# Patient Record
Sex: Female | Born: 1935 | Race: White | Hispanic: No | Marital: Married | Smoking: Never smoker
Health system: Southern US, Community
[De-identification: ages and names within clinical notes are randomized; demographics above are authoritative.]

## PROBLEM LIST (undated history)

## (undated) DIAGNOSIS — R5383 Other fatigue: Secondary | ICD-10-CM

## (undated) DIAGNOSIS — G4700 Insomnia, unspecified: Secondary | ICD-10-CM

## (undated) DIAGNOSIS — E079 Disorder of thyroid, unspecified: Secondary | ICD-10-CM

## (undated) DIAGNOSIS — R42 Dizziness and giddiness: Secondary | ICD-10-CM

## (undated) DIAGNOSIS — T7840XA Allergy, unspecified, initial encounter: Secondary | ICD-10-CM

## (undated) DIAGNOSIS — E785 Hyperlipidemia, unspecified: Secondary | ICD-10-CM

## (undated) HISTORY — DX: Dizziness and giddiness: R42

## (undated) HISTORY — DX: Insomnia, unspecified: G47.00

## (undated) HISTORY — DX: Other fatigue: R53.83

## (undated) HISTORY — PX: COCHLEAR IMPLANT: SUR684

## (undated) HISTORY — DX: Disorder of thyroid, unspecified: E07.9

## (undated) HISTORY — PX: CHOLECYSTECTOMY: SHX55

## (undated) HISTORY — DX: Allergy, unspecified, initial encounter: T78.40XA

## (undated) HISTORY — DX: Hyperlipidemia, unspecified: E78.5

---

## 2005-10-31 ENCOUNTER — Ambulatory Visit: Payer: Self-pay | Admitting: Family Medicine

## 2006-04-26 ENCOUNTER — Ambulatory Visit: Payer: Self-pay | Admitting: Gastroenterology

## 2006-05-31 ENCOUNTER — Ambulatory Visit: Payer: Self-pay

## 2006-12-27 ENCOUNTER — Ambulatory Visit: Payer: Self-pay | Admitting: Family Medicine

## 2007-11-19 ENCOUNTER — Ambulatory Visit: Payer: Self-pay | Admitting: Family Medicine

## 2007-12-24 ENCOUNTER — Ambulatory Visit: Payer: Self-pay | Admitting: General Surgery

## 2008-04-15 ENCOUNTER — Ambulatory Visit: Payer: Self-pay | Admitting: Family Medicine

## 2009-05-20 ENCOUNTER — Ambulatory Visit: Payer: Self-pay | Admitting: Family Medicine

## 2009-09-03 ENCOUNTER — Encounter: Payer: Self-pay | Admitting: Otolaryngology

## 2009-09-15 ENCOUNTER — Encounter: Payer: Self-pay | Admitting: Otolaryngology

## 2010-06-10 ENCOUNTER — Ambulatory Visit: Payer: Self-pay | Admitting: Otolaryngology

## 2010-08-01 ENCOUNTER — Ambulatory Visit: Payer: Self-pay | Admitting: Gastroenterology

## 2011-07-26 ENCOUNTER — Ambulatory Visit: Payer: Self-pay | Admitting: Internal Medicine

## 2011-08-09 ENCOUNTER — Ambulatory Visit: Payer: Self-pay | Admitting: Internal Medicine

## 2012-10-30 ENCOUNTER — Ambulatory Visit: Payer: Self-pay | Admitting: Internal Medicine

## 2013-01-06 ENCOUNTER — Ambulatory Visit: Payer: Self-pay | Admitting: Internal Medicine

## 2013-04-12 IMAGING — US ULTRASOUND RIGHT BREAST
1 series · 14 of 14 positions shown · non-contrast
Comparison: none

REASON FOR EXAM: nodule
COMMENTS:

PROCEDURE:     US  - US BREAST RIGHT  - August 09, 2011  [DATE]
RESULT:     Evaluation in the region of interest within the right breast, in
the periareolar region, demonstrates no solid or cystic sonographic
abnormalities.

[Series 1: ultrasound right breast · 14 of 14 slices shown]
[im 1/14]
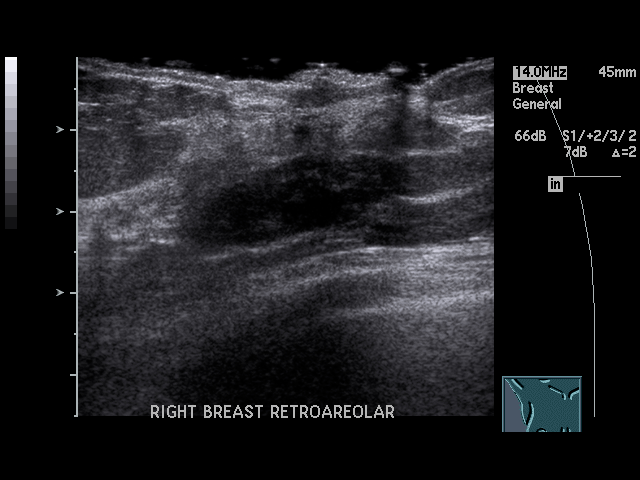
[im 2/14]
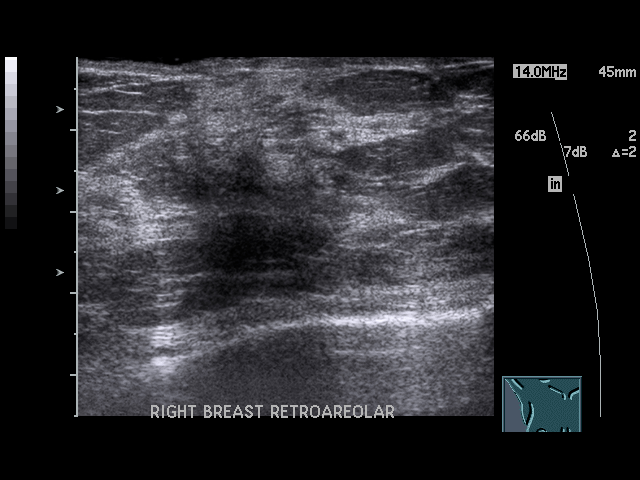
[im 3/14]
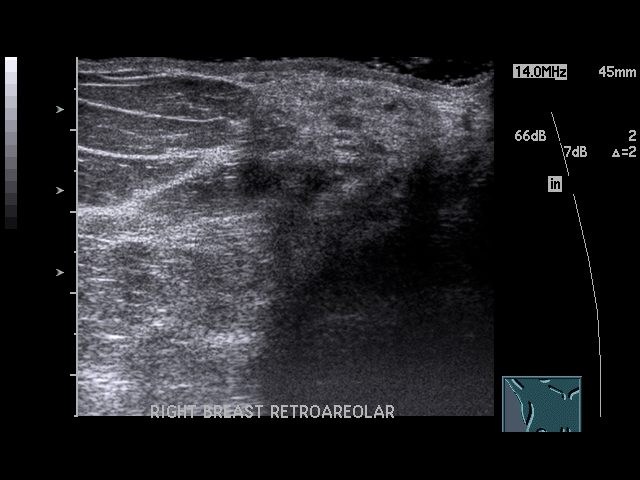
[im 4/14]
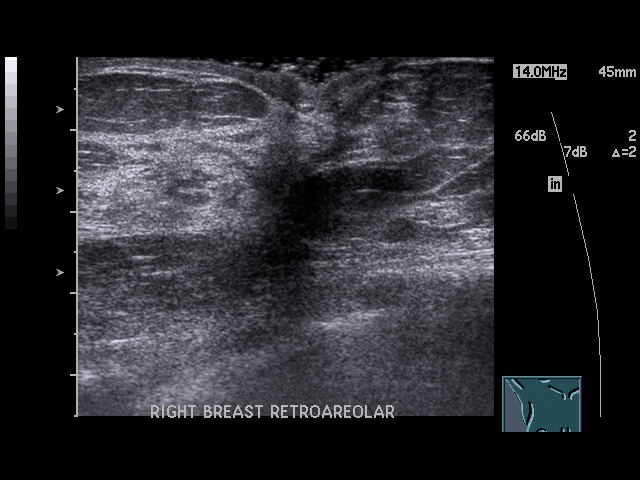
[im 5/14]
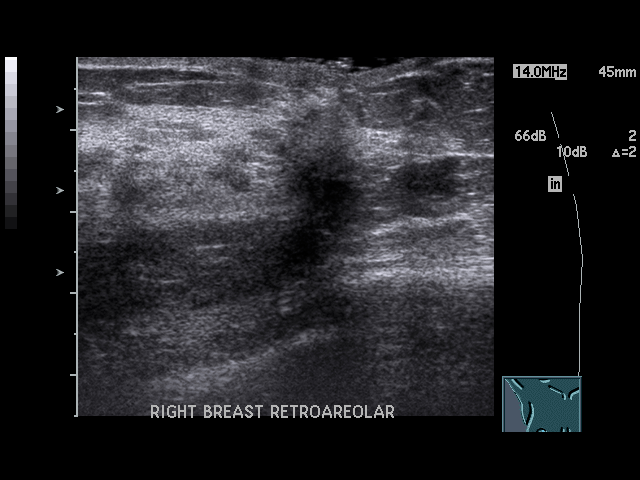
[im 6/14]
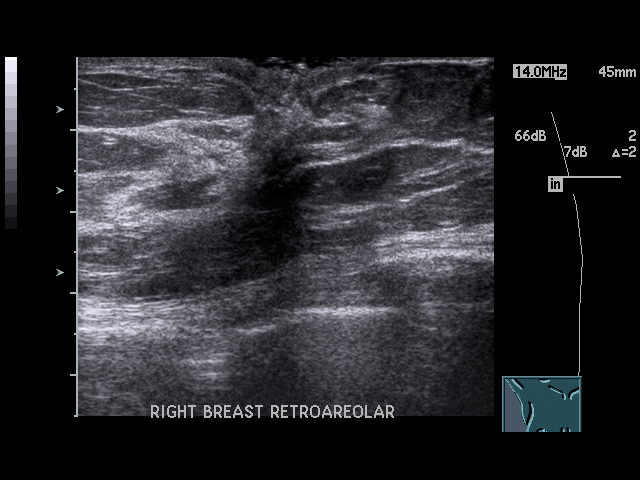
[im 7/14]
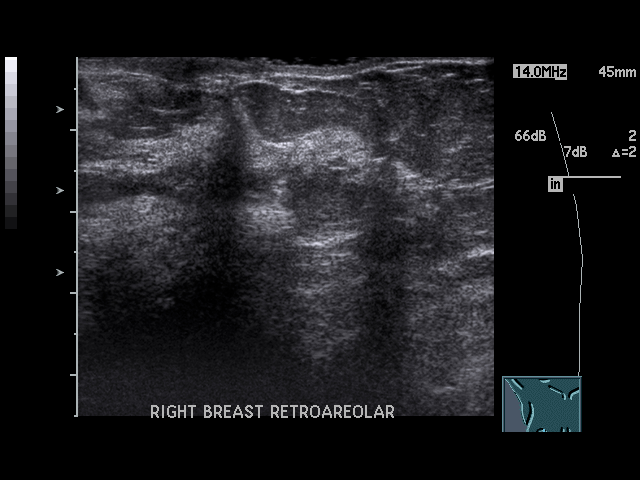
[im 8/14]
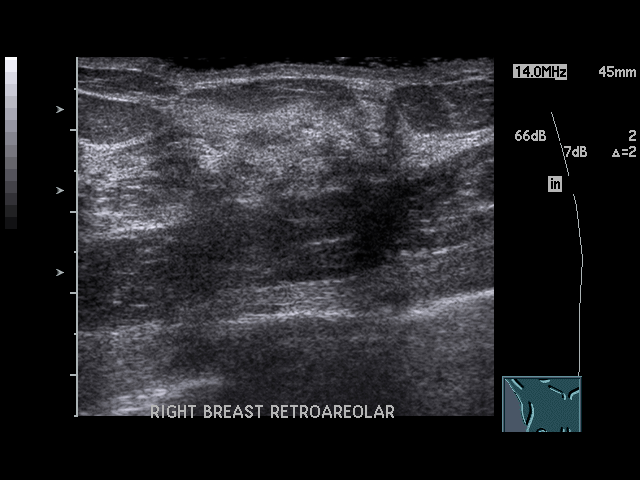
[im 9/14]
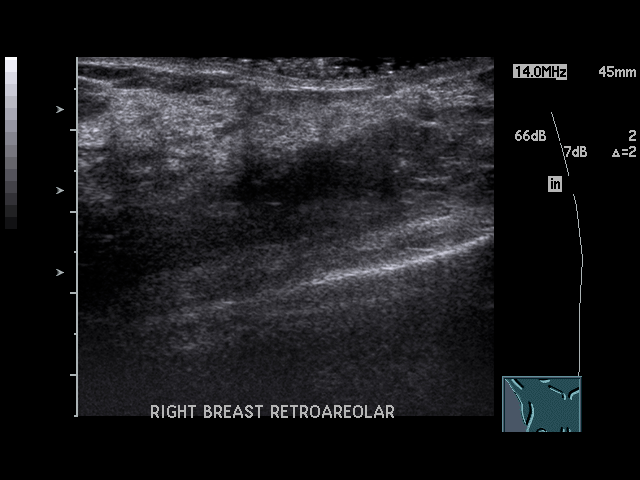
[im 10/14]
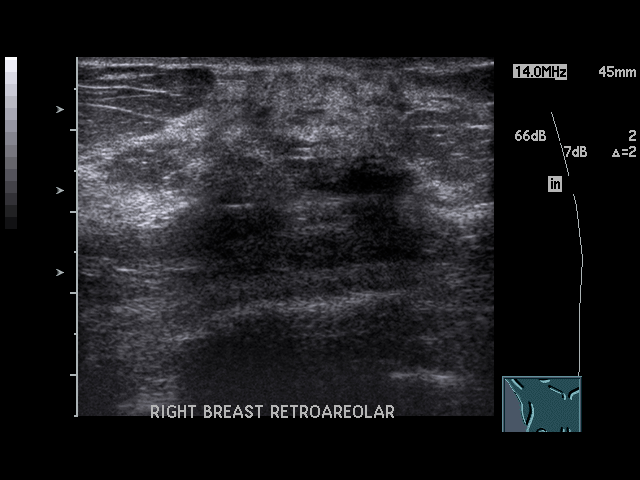
[im 11/14]
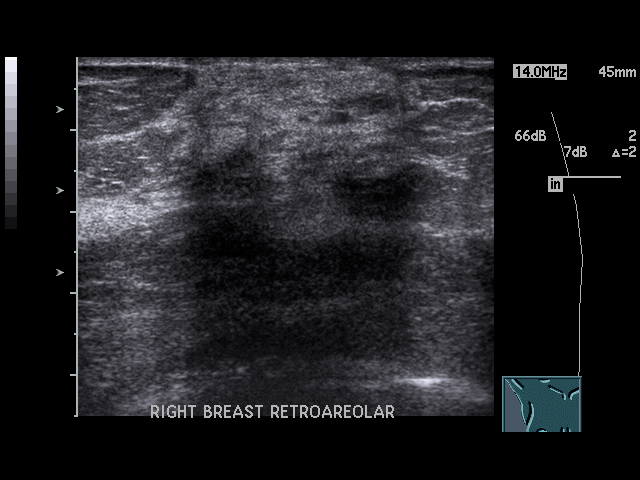
[im 12/14]
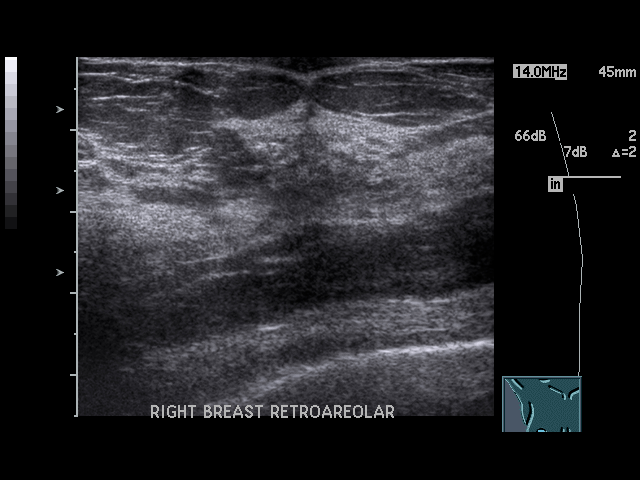
[im 13/14]
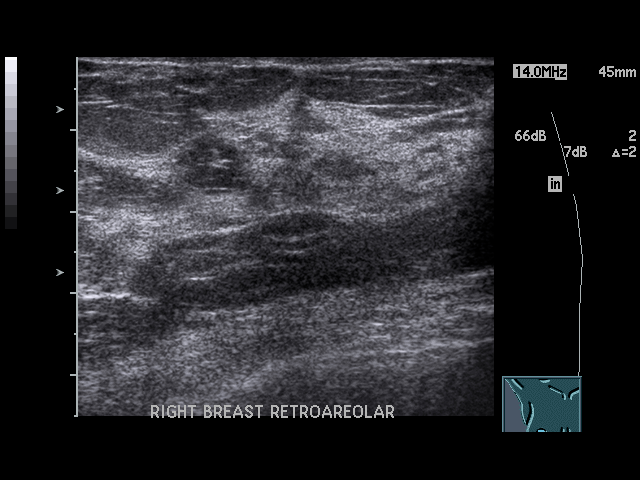
[im 14/14]
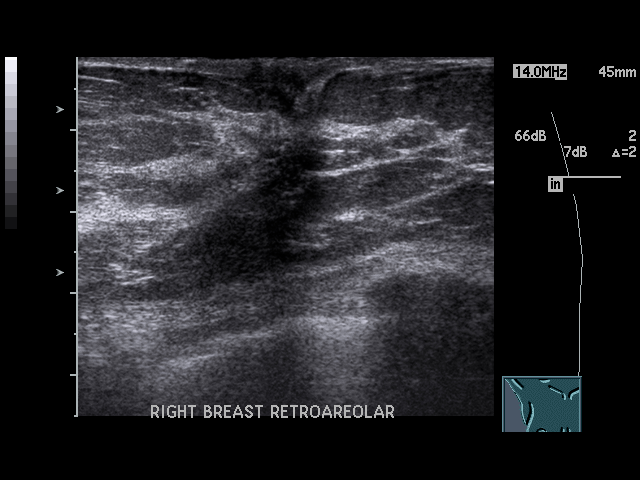

[14 of 14 positions shown; findings below may reference images not displayed]

IMPRESSION: Unremarkable Right Breast Ultrasound. Please refer to
additional radiographic view dictation for completed discussion.

## 2014-11-18 ENCOUNTER — Emergency Department: Payer: Self-pay | Admitting: Student

## 2014-11-18 LAB — URINALYSIS, COMPLETE
BILIRUBIN, UR: NEGATIVE
Blood: NEGATIVE
GLUCOSE, UR: NEGATIVE mg/dL (ref 0–75)
Hyaline Cast: 28
Nitrite: NEGATIVE
Ph: 6 (ref 4.5–8.0)
Protein: 30
SQUAMOUS EPITHELIAL: NONE SEEN
Specific Gravity: 1.023 (ref 1.003–1.030)

## 2014-11-18 LAB — COMPREHENSIVE METABOLIC PANEL
ALT: 27 U/L (ref 14–63)
Albumin: 4 g/dL (ref 3.4–5.0)
Alkaline Phosphatase: 94 U/L (ref 46–116)
Anion Gap: 13 (ref 7–16)
BUN: 19 mg/dL — ABNORMAL HIGH (ref 7–18)
Bilirubin,Total: 1.4 mg/dL — ABNORMAL HIGH (ref 0.2–1.0)
CALCIUM: 9.6 mg/dL (ref 8.5–10.1)
CREATININE: 0.8 mg/dL (ref 0.60–1.30)
Chloride: 104 mmol/L (ref 98–107)
Co2: 22 mmol/L (ref 21–32)
EGFR (Non-African Amer.): >60
GLUCOSE: 151 mg/dL — AB (ref 65–99)
Osmolality: 283 (ref 275–301)
Potassium: 4.1 mmol/L (ref 3.5–5.1)
SGOT(AST): 38 U/L — ABNORMAL HIGH (ref 15–37)
SODIUM: 139 mmol/L (ref 136–145)
TOTAL PROTEIN: 7.4 g/dL (ref 6.4–8.2)

## 2014-11-18 LAB — CBC
HCT: 47.9 % — ABNORMAL HIGH (ref 35.0–47.0)
HGB: 16.3 g/dL — AB (ref 12.0–16.0)
MCH: 32.1 pg (ref 26.0–34.0)
MCHC: 33.9 g/dL (ref 32.0–36.0)
MCV: 95 fL (ref 80–100)
PLATELETS: 213 10*3/uL (ref 150–440)
RBC: 5.06 10*6/uL (ref 3.80–5.20)
RDW: 12.7 % (ref 11.5–14.5)
WBC: 9.3 10*3/uL (ref 3.6–11.0)

## 2014-11-18 LAB — TROPONIN I: Troponin-I: <0.02 ng/mL

## 2014-11-18 LAB — LIPASE, BLOOD: Lipase: 128 U/L (ref 73–393)

## 2015-07-01 ENCOUNTER — Encounter: Payer: Self-pay | Admitting: Family Medicine

## 2015-07-01 ENCOUNTER — Ambulatory Visit (INDEPENDENT_AMBULATORY_CARE_PROVIDER_SITE_OTHER): Payer: Medicare PPO | Admitting: Family Medicine

## 2015-07-01 VITALS — BP 132/80 | HR 81 | Temp 97.9°F | Resp 16 | Ht 63.5 in | Wt 137.6 lb

## 2015-07-01 DIAGNOSIS — D751 Secondary polycythemia: Secondary | ICD-10-CM | POA: Diagnosis not present

## 2015-07-01 DIAGNOSIS — E559 Vitamin D deficiency, unspecified: Secondary | ICD-10-CM

## 2015-07-01 DIAGNOSIS — Z23 Encounter for immunization: Secondary | ICD-10-CM | POA: Diagnosis not present

## 2015-07-01 DIAGNOSIS — E785 Hyperlipidemia, unspecified: Secondary | ICD-10-CM

## 2015-07-01 DIAGNOSIS — E039 Hypothyroidism, unspecified: Secondary | ICD-10-CM

## 2015-07-01 DIAGNOSIS — Z9621 Cochlear implant status: Secondary | ICD-10-CM | POA: Diagnosis not present

## 2015-07-01 DIAGNOSIS — R739 Hyperglycemia, unspecified: Secondary | ICD-10-CM | POA: Diagnosis not present

## 2015-07-01 DIAGNOSIS — E876 Hypokalemia: Secondary | ICD-10-CM

## 2015-07-01 DIAGNOSIS — R42 Dizziness and giddiness: Secondary | ICD-10-CM | POA: Diagnosis not present

## 2015-07-01 MED ORDER — DIAZEPAM 2 MG PO TABS: 2.0000 mg | ORAL_TABLET | Freq: Four times a day (QID) | ORAL | Status: AC | PRN

## 2015-07-01 MED ORDER — ZOSTER VACCINE LIVE 19400 UNT/0.65ML ~~LOC~~ SOLR: 0.6500 mL | Freq: Once | SUBCUTANEOUS | Status: AC

## 2015-07-01 NOTE — Progress Notes (Signed)
Date:  07/01/2015   Name:  Margaret Solis   DOB:  1934-06-30   MRN:  [id number]  PCP:  Filbert Berthold, NP    Chief Complaint: Hypothyroidism   History of Present Illness:  This is a 81 y.o. female with cochlear implant L ear, gets intermittent vertigo/nausea for which she takes Valium 2 mg prn, perhaps once a month on average. Leaving soon for vacation and travel often exacerbates, needs refill. Denies any significant sedation or confusion with med. On alternating Synthroid 75/50 daily, no labs since dose change. On vitamin D supplementation but unsure of dose. Also taking niacin for HLD but unsure dose. Blood work in February showed hyperglycemia, elevated AST, polycythemia. Last tetanus 3 yrs ago, got flu imm 2d ago, needs pneumococcal and zoster imms.  Review of Systems:  Review of Systems  Constitutional: Negative for fever, fatigue and unexpected weight change.  Respiratory: Negative for shortness of breath.   Cardiovascular: Negative for chest pain and leg swelling.  Gastrointestinal: Negative for abdominal pain.  Neurological: Negative for tremors and light-headedness.    Patient Active Problem List   Diagnosis Date Noted  . Hypothyroidism 07/01/2015  . Hyperlipidemia 07/01/2015  . Vitamin D deficiency 07/01/2015  . Vertigo, intermittent 07/01/2015  . Cochlear implant in place 07/01/2015  . Hyperglycemia 07/01/2015  . Polycythemia 07/01/2015    Prior to Admission medications   Medication Sig Start Date End Date Taking? Authorizing Provider  hydrochlorothiazide (HYDRODIURIL) 25 MG tablet Take 25 mg by mouth every morning. 04/28/15  Yes Historical Provider, MD  KLOR-CON M20 20 MEQ tablet TAKE 1 TABLET BY MOUTH ONCE DAILY WITH FOOD 04/28/15  Yes Historical Provider, MD  levothyroxine (SYNTHROID, LEVOTHROID) 50 MCG tablet Take 50 mcg by mouth daily before breakfast. Alternate as directed   Yes Historical Provider, MD  levothyroxine (SYNTHROID, LEVOTHROID) 75 MCG tablet Take 75 mcg by  mouth daily before breakfast. Alternate days   Yes Historical Provider, MD  mometasone (ELOCON) 0.1 % lotion PLACE 4 DROPS EACH EAR AT BEDTIME X 1 WEEK AND THEN AS NEEDED FOR DRY SKIN AND ITCHING 05/18/15  Yes Historical Provider, MD  diazepam (VALIUM) 2 MG tablet Take 1 tablet (2 mg total) by mouth every 6 (six) hours as needed (for dizziness). 07/01/15   Schuyler Amor, MD  zoster vaccine live, PF, (ZOSTAVAX) 95621 UNT/0.65ML injection Inject 19,400 Units into the skin once. 07/01/15   Schuyler Amor, MD    No Known Allergies  Past Surgical History  Procedure Laterality Date  . Cholecystectomy    . Cochlear implant      Social History  Substance Use Topics  . Smoking status: Never Smoker   . Smokeless tobacco: Not on file  . Alcohol Use: 0.0 oz/week    0 Standard drinks or equivalent per week    Family History  Problem Relation Age of Onset  . Hypertension Son     Medication list has been reviewed and updated.  Physical Examination: BP 132/80 mmHg  Pulse 81  Temp(Src) 97.9 F (36.6 C) (Oral)  Resp 16  Ht 5' 3.5" (1.613 m)  Wt 137 lb 9.6 oz (62.415 kg)  BMI 23.99 kg/m2  Physical Exam  Constitutional: She appears well-developed and well-nourished.  Cardiovascular: Normal rate, regular rhythm and normal heart sounds.   Pulmonary/Chest: Effort normal and breath sounds normal.  Musculoskeletal: She exhibits no edema.  Neurological: She is alert. Coordination normal.  Skin: Skin is warm and dry.  Psychiatric: She has a normal mood  and affect. Her behavior is normal.  Nursing note and vitals reviewed.   Assessment and Plan:  1. Vertigo, intermittent Due to cochlear implant, continue HCTZ and Klor-con - diazepam (VALIUM) 2 MG tablet; Take 1 tablet (2 mg total) by mouth every 6 (six) hours as needed (for dizziness).  Dispense: 30 tablet; Refill: 0  2. Hypothyroidism, unspecified hypothyroidism type Recheck lab on alternating dose Synthroid - TSH  3.  Hyperlipidemia Recheck lipids on niacin - Lipid Profile  4. Vitamin D deficiency Recheck level on supplementation - Vitamin D (25 hydroxy)  5. Cochlear implant in place  6. Hyperglycemia - HgB A1c - Comprehensive metabolic panel  7. Polycythemia - CBC  8. Delayed imms Prevnar today, rx for Zostavax sent   Return in about 6 months (around 12/29/2015).  Dionne Ano. Kingsley Spittle MD [facility]  07/01/2015

## 2015-07-02 ENCOUNTER — Telehealth: Payer: Self-pay | Admitting: Family Medicine

## 2015-07-02 LAB — CBC
Hematocrit: 38.3 % (ref 34.0–46.6)
Hemoglobin: 13.5 g/dL (ref 11.1–15.9)
MCH: 32.5 pg (ref 26.6–33.0)
MCHC: 35.2 g/dL (ref 31.5–35.7)
MCV: 92 fL (ref 79–97)
PLATELETS: 205 10*3/uL (ref 150–379)
RBC: 4.15 x10E6/uL (ref 3.77–5.28)
RDW: 13.1 % (ref 12.3–15.4)
WBC: 6.5 10*3/uL (ref 3.4–10.8)

## 2015-07-02 NOTE — Telephone Encounter (Signed)
April at [facility] needs a [facility] referral for pt's visit today.  She saw [employer].  April's call back number is [phone]

## 2015-07-02 NOTE — Telephone Encounter (Signed)
Pt not found on [employer] portal.

## 2015-07-03 LAB — COMPREHENSIVE METABOLIC PANEL
ALK PHOS: 73 IU/L (ref 39–117)
ALT: 11 IU/L (ref 0–32)
AST: 16 IU/L (ref 0–40)
Albumin/Globulin Ratio: 1.8 (ref 1.1–2.5)
Albumin: 4.1 g/dL (ref 3.5–4.8)
BUN/Creatinine Ratio: 29 — ABNORMAL HIGH (ref 11–26)
BUN: 18 mg/dL (ref 8–27)
Bilirubin Total: 0.7 mg/dL (ref 0.0–1.2)
CALCIUM: 9.1 mg/dL (ref 8.7–10.3)
CO2: 20 mmol/L (ref 18–29)
CREATININE: 0.62 mg/dL (ref 0.57–1.00)
Chloride: 100 mmol/L (ref 97–108)
GFR calc Af Amer: 99 mL/min/{1.73_m2} (ref >59)
GFR, EST NON AFRICAN AMERICAN: 86 mL/min/{1.73_m2} (ref >59)
GLOBULIN, TOTAL: 2.3 g/dL (ref 1.5–4.5)
GLUCOSE: 82 mg/dL (ref 65–99)
Potassium: 4.3 mmol/L (ref 3.5–5.2)
SODIUM: 141 mmol/L (ref 134–144)
Total Protein: 6.4 g/dL (ref 6.0–8.5)

## 2015-07-03 LAB — LIPID PANEL
Chol/HDL Ratio: 3.9 ratio units (ref 0.0–4.4)
Cholesterol, Total: 260 mg/dL — ABNORMAL HIGH (ref 100–199)
HDL: 66 mg/dL (ref >39)
LDL CALC: 167 mg/dL — AB (ref 0–99)
TRIGLYCERIDES: 135 mg/dL (ref 0–149)

## 2015-07-03 LAB — TSH: TSH: 0.791 u[IU]/mL (ref 0.450–4.500)

## 2015-07-03 LAB — HEMOGLOBIN A1C: HEMOGLOBIN A1C: 5.7 % — AB (ref 4.8–5.6)

## 2015-07-03 LAB — VITAMIN D 25 HYDROXY (VIT D DEFICIENCY, FRACTURES): VIT D 25 HYDROXY: 33.9 ng/mL (ref 30.0–100.0)

## 2015-07-05 ENCOUNTER — Telehealth: Payer: Self-pay | Admitting: Family Medicine

## 2015-07-05 NOTE — Telephone Encounter (Signed)
Pt. Came by today needing a referral to Dr. Patrici Ranks  Chiropractic pt states that she had appt. Today [phone], and maybe  9-23. Pt call back # is  [phone]

## 2015-07-05 NOTE — Telephone Encounter (Signed)
Called the insurance company she doesn't need any approval from her insurance for routine office visit and reference number for this call is [id number] and [facility]

## 2015-07-06 NOTE — Telephone Encounter (Signed)
Pt advised.

## 2015-09-21 ENCOUNTER — Other Ambulatory Visit: Payer: Self-pay

## 2015-10-20 ENCOUNTER — Telehealth: Payer: Self-pay | Admitting: Family Medicine

## 2015-10-20 MED ORDER — LEVOTHYROXINE SODIUM 75 MCG PO TABS: 75.0000 ug | ORAL_TABLET | Freq: Every day | ORAL | Status: DC

## 2015-10-20 NOTE — Telephone Encounter (Signed)
Please let her know this was sent to the pharmacy.  I would like to check her TSH level in the near future. Can she come by and pick up a lab slip sometime soon?  If so, can you order this?

## 2015-10-20 NOTE — Telephone Encounter (Signed)
Pt states she need a 90 day supply of  75 mg of Levothroid she said she did better with the doses  [facility] pt cal back # [phone].

## 2015-10-20 NOTE — Telephone Encounter (Signed)
Left detailed message on home ans. Machine.

## 2015-10-22 ENCOUNTER — Other Ambulatory Visit: Payer: Self-pay | Admitting: Family Medicine

## 2015-10-22 DIAGNOSIS — E039 Hypothyroidism, unspecified: Secondary | ICD-10-CM

## 2015-10-29 LAB — TSH: TSH: 0.578 u[IU]/mL (ref 0.450–4.500)

## 2015-12-13 ENCOUNTER — Other Ambulatory Visit: Payer: Self-pay | Admitting: Family Medicine

## 2016-08-11 ENCOUNTER — Ambulatory Visit (INDEPENDENT_AMBULATORY_CARE_PROVIDER_SITE_OTHER): Payer: Medicare PPO | Admitting: Family Medicine

## 2016-08-11 ENCOUNTER — Encounter: Payer: Self-pay | Admitting: Family Medicine

## 2016-08-11 VITALS — BP 136/74 | HR 75 | Temp 98.0°F | Resp 16 | Ht 63.5 in | Wt 140.0 lb

## 2016-08-11 DIAGNOSIS — I499 Cardiac arrhythmia, unspecified: Secondary | ICD-10-CM

## 2016-08-11 DIAGNOSIS — I493 Ventricular premature depolarization: Secondary | ICD-10-CM | POA: Diagnosis not present

## 2016-08-11 DIAGNOSIS — R110 Nausea: Secondary | ICD-10-CM

## 2016-08-11 MED ORDER — ONDANSETRON HCL 4 MG PO TABS: 4.0000 mg | ORAL_TABLET | Freq: Three times a day (TID) | ORAL | 0 refills | Status: AC | PRN

## 2016-08-11 NOTE — Patient Instructions (Signed)
Thank you for coming in to clinic today.  1. I am reassured by both of your symptoms, I think the most likely explanation is stress induced with the nausea and upset stomach. This should improve on it's own. You may try OTC Pepto Bismol for symptomatic relief of upset stomach, try to avoid spicy and greasy foods as well. Stay well hydrated with plenty of water. If you have worsening nausea, then try the Zofran rx medication sent to pharmacy one pill every 8 hours as needed for nausea and vomiting. Additionally for the irregular heart beats, you may have what is called "PVCs - premature ventricular contractions" which are the most common type of occasional irregular heart beats or palpitations. From review of your prior EKG and my exam and your history without other symptoms of chest pain and shortness of breath, I do not think you have Atrial Fibrillation or other more concerning heart arrhythmia. Your old EKG did show some old Left Bundle Branch Block changes, this can be due to a variety of causes, I do recommend that you establish with a Cardiologist after your move to re-evaluate this, and may benefit from ECHO cardio gram or stress test to rule out any heart injury as well.  Please schedule a follow-up appointment with Dr. Althea CharonKaramalegos as needed within 2 to 4 weeks for nausea, irregular heart beat as needed if worsening  If you have any other questions or concerns, please feel free to call the clinic or send a message through MyChart. You may also schedule an earlier appointment if necessary.  Saralyn PilarAlexander Dezaree Tracey, DO [facility]

## 2016-08-11 NOTE — Progress Notes (Signed)
Subjective:    Patient ID: Margaret Solis, female    DOB: [birth year], 82 y.o.   MRN: [id number]  Margaret Solis is a 82 y.o. female presenting on 08/11/2016 for Irregular Heart Beat (pt had cataract surgery and was told that has irregular heart beat needed to be ckecked out by PCP) and Nausea (onset 5 days don't really throw up but get nauseated)   HPI  IRREGULAR HEART BEAT: - Reports recently had Left cataract surgery last week, and now has R cataract removal for next week Wednesday, she was told during last procedure that she had some "irregular heart beats" on the heart monitor, she was not told what specific abnormality and denies that she was told problem with atrial fibrillation. She has had this same chronic problem since 2010, with variety of doctors and evaluations in the past without any clear diagnosis, in 2013 she had a heart monitor placed for about 1 week or less that she states was unremarkable, most recently in ED 11/2014 with viral gastroenteritis similar problem - Currently she is asymptomatic without feeling any palpitations. Describes an occasional symptom of irregular palpitations with "heart thumping" usually for few minutes then resolves, will occur about every 2-3 weeks, often will notice only at night, no clear trigger or onset - Does admit to recent significant increased stress recently with upcoming move to [place], seems to notice this problem more - Never established with Cardiology. No prior MI or CVA. - H/o Hypothyroidism on levothyroxine, last TSH 10/2015 - Denies any chest pain, shortness of breath, no exertional symptoms (tolerates walking up to 3 miles), headache, lightheadedness, near syncope/syncope, dizziness, vertigo, fever/chills, recent illness   NAUSEA WITHOUT VOMITING: - Prior history of recurrent nausea usually associated with anxiety and stress. Recently worse nausea and upset stomach over past 2 weeks, she is currently in process of moving to  , upset stomach without abdominal pain or cramping - Denies diarrhea, constipation, vomiting, dark stools or blood per rectum  Social History  Substance Use Topics  . Smoking status: Never Smoker  . Smokeless tobacco: Not on file  . Alcohol use 0.0 oz/week    Review of Systems Per HPI unless specifically indicated above     Objective:    BP 136/74   Pulse 75   Temp 98 F (36.7 C) (Oral)   Resp 16   Ht 5' 3.5" (1.613 m)   Wt 140 lb (63.5 kg)   BMI 24.41 kg/m   Wt Readings from Last 3 Encounters:  08/11/16 140 lb (63.5 kg)  07/01/15 137 lb 9.6 oz (62.4 kg)    Physical Exam  Constitutional: She is oriented to person, place, and time. She appears well-developed and well-nourished. No distress.  Well-appearing, comfortable, cooperative  HENT:  Head: Normocephalic and atraumatic.  Mouth/Throat: Oropharynx is clear and moist.  Cardiovascular: Normal rate, regular rhythm, normal heart sounds and intact distal pulses.   No murmur heard. No ectopy on palpation with auscultation today  Pulmonary/Chest: Effort normal and breath sounds normal. No respiratory distress. She has no wheezes. She has no rales.  Abdominal: Soft. Bowel sounds are normal. She exhibits no distension and no mass. There is no tenderness.  Musculoskeletal: She exhibits no edema.  Neurological: She is alert and oriented to person, place, and time.  Skin: Skin is warm and dry. No rash noted. She is not diaphoretic.  Psychiatric: She has a normal mood and affect.  Nursing note and vitals reviewed.   Results for orders  placed or performed in visit on 10/22/15  TSH  Result Value Ref Range   TSH 0.578 0.450 - 4.500 uIU/mL      Assessment & Plan:   Problem List Items Addressed This Visit    None    Visit Diagnoses    Nausea    -  Primary   History and exam uncomplicated, w/o vomiting. Trial OTC pepto first for upset stomach likely due to stress, rx Zofran if needed. Hydrate well.   Relevant  Medications   ondansetron (ZOFRAN) 4 MG tablet   Irregular heart beats       Possible PVCs given history, infrequent usually only at night, asymptomatic. No red flag cardiac symptoms. No prior ectopy or Afib on old EKG 11/2015 with some incomplete LBBB, no other comparison. Unlikely AFib, possible paroxysmal afib but did have prior holter monitor without identified problem. Reassurance today, printed copy of old EKG 11/2015 given to patient, as [place] is [place] of [place], advised future follow-up with Cardiology given LBBB, may benefit from further work-up with follow-up EKG and ECHO.   Asymptomatic PVCs          Meds ordered this encounter  Medications  .       .       .       .       .       .       .       .   . ondansetron (ZOFRAN) 4 MG tablet    Sig: Take 1 tablet (4 mg total) by mouth every 8 (eight) hours as needed for nausea or vomiting.    Dispense:  20 tablet    Refill:  0      Follow up plan: Return in about 4 weeks (around 09/08/2016), or if symptoms worsen or fail to improve, for 2 to 4 weeks for nausea, irregular heart beat as needed if worsening.  Saralyn PilarAlexander Karamalegos, DO [facility] 08/11/2016, 12:46 PM

## 2016-09-27 ENCOUNTER — Other Ambulatory Visit: Payer: Self-pay | Admitting: Family Medicine

## 2016-09-27 DIAGNOSIS — E039 Hypothyroidism, unspecified: Secondary | ICD-10-CM

## 2016-09-27 MED ORDER — LEVOTHYROXINE SODIUM 75 MCG PO TABS
75.0000 ug | ORAL_TABLET | Freq: Every day | ORAL | 3 refills | Status: AC
Start: 2016-09-27 — End: ?

## 2016-10-12 ENCOUNTER — Telehealth: Payer: Self-pay | Admitting: Family Medicine

## 2016-10-12 NOTE — Telephone Encounter (Signed)
[facility] said pt sent a records request because she has recently moved to [place].  Pt has appt tomorrow at 11:00 and asked if we could send the med list, immunizations, last 2 or 3 office notes and health info (whether diabetes, hypertension, etc).  Please fax to [phone].  Her call back number is [phone]

## 2016-10-12 NOTE — Telephone Encounter (Signed)
done
